# Patient Record
Sex: Female | Born: 1985 | Race: Black or African American | Hispanic: No | Marital: Single | Smoking: Never smoker
Health system: Southern US, Community
[De-identification: ages and names within clinical notes are randomized; demographics above are authoritative.]

---

## 2015-03-05 ENCOUNTER — Emergency Department
Admission: EM | Admit: 2015-03-05 | Discharge: 2015-03-05 | Disposition: A | Discharge status: Home / Self Care | Payer: No Typology Code available for payment source | Attending: Student | Admitting: Student

## 2015-03-05 ENCOUNTER — Emergency Department: Payer: No Typology Code available for payment source

## 2015-03-05 ENCOUNTER — Encounter: Payer: Self-pay | Admitting: Emergency Medicine

## 2015-03-05 DIAGNOSIS — S60512A Abrasion of left hand, initial encounter: Secondary | ICD-10-CM | POA: Diagnosis not present

## 2015-03-05 DIAGNOSIS — S20219A Contusion of unspecified front wall of thorax, initial encounter: Secondary | ICD-10-CM | POA: Diagnosis not present

## 2015-03-05 DIAGNOSIS — Y998 Other external cause status: Secondary | ICD-10-CM | POA: Diagnosis not present

## 2015-03-05 DIAGNOSIS — S161XXA Strain of muscle, fascia and tendon at neck level, initial encounter: Secondary | ICD-10-CM | POA: Insufficient documentation

## 2015-03-05 DIAGNOSIS — S8001XA Contusion of right knee, initial encounter: Secondary | ICD-10-CM | POA: Insufficient documentation

## 2015-03-05 DIAGNOSIS — Y92410 Unspecified street and highway as the place of occurrence of the external cause: Secondary | ICD-10-CM | POA: Insufficient documentation

## 2015-03-05 DIAGNOSIS — Y9389 Activity, other specified: Secondary | ICD-10-CM | POA: Insufficient documentation

## 2015-03-05 DIAGNOSIS — S199XXA Unspecified injury of neck, initial encounter: Secondary | ICD-10-CM | POA: Diagnosis present

## 2015-03-05 MED ORDER — TRIPLE ANTIBIOTIC 3.5-400-5000 EX OINT: TOPICAL_OINTMENT | Freq: Once | CUTANEOUS | Status: DC

## 2015-03-05 MED ORDER — IBUPROFEN 800 MG PO TABS
800.0000 mg | ORAL_TABLET | Freq: Once | ORAL | Status: AC
  Administered 2015-03-05: 800 mg via ORAL
  Filled 2015-03-05: qty 1

## 2015-03-05 MED ORDER — TRAMADOL HCL 50 MG PO TABS: 50.0000 mg | ORAL_TABLET | Freq: Four times a day (QID) | ORAL | Status: AC | PRN

## 2015-03-05 MED ORDER — OXYCODONE-ACETAMINOPHEN 5-325 MG PO TABS
1.0000 | ORAL_TABLET | Freq: Once | ORAL | Status: AC
  Administered 2015-03-05: 1 via ORAL
  Filled 2015-03-05: qty 1

## 2015-03-05 MED ORDER — IBUPROFEN 800 MG PO TABS: 800.0000 mg | ORAL_TABLET | Freq: Three times a day (TID) | ORAL | Status: AC | PRN

## 2015-03-05 MED ORDER — CYCLOBENZAPRINE HCL 10 MG PO TABS: 10.0000 mg | ORAL_TABLET | Freq: Three times a day (TID) | ORAL | Status: AC | PRN

## 2015-03-05 MED ORDER — CYCLOBENZAPRINE HCL 10 MG PO TABS
5.0000 mg | ORAL_TABLET | Freq: Once | ORAL | Status: AC
  Administered 2015-03-05: 5 mg via ORAL
  Filled 2015-03-05: qty 1
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
## 2017-10-19 ENCOUNTER — Ambulatory Visit: Payer: Self-pay | Admitting: Family Medicine
